# Patient Record
Sex: Male | Born: 1965 | Race: Black or African American | Hispanic: No | Marital: Married | Smoking: Current every day smoker
Health system: Southern US, Community
[De-identification: ages and names within clinical notes are randomized; demographics above are authoritative.]

## PROBLEM LIST (undated history)

## (undated) HISTORY — PX: HERNIA REPAIR: SHX51

---

## 2006-07-17 ENCOUNTER — Emergency Department: Payer: Self-pay | Admitting: Emergency Medicine

## 2008-02-05 ENCOUNTER — Emergency Department: Payer: Self-pay | Admitting: Emergency Medicine

## 2008-08-04 ENCOUNTER — Emergency Department: Payer: Self-pay | Admitting: Emergency Medicine

## 2009-05-31 ENCOUNTER — Emergency Department: Payer: Self-pay | Admitting: Emergency Medicine

## 2009-09-13 ENCOUNTER — Emergency Department: Payer: Self-pay | Admitting: Emergency Medicine

## 2010-08-01 ENCOUNTER — Emergency Department: Payer: Self-pay | Admitting: Emergency Medicine

## 2010-08-26 ENCOUNTER — Ambulatory Visit: Payer: Self-pay | Admitting: Surgery

## 2010-09-13 ENCOUNTER — Ambulatory Visit: Payer: Self-pay | Admitting: Surgery

## 2011-10-03 ENCOUNTER — Emergency Department: Payer: Self-pay | Admitting: Emergency Medicine

## 2014-07-27 ENCOUNTER — Emergency Department: Payer: Self-pay | Admitting: Emergency Medicine

## 2014-07-27 LAB — URINALYSIS, COMPLETE
BILIRUBIN, UR: NEGATIVE
BLOOD: NEGATIVE
GLUCOSE, UR: NEGATIVE mg/dL (ref 0–75)
Hyaline Cast: 8
Leukocyte Esterase: NEGATIVE
NITRITE: NEGATIVE
PH: 5 (ref 4.5–8.0)
Protein: NEGATIVE
RBC,UR: 5 /HPF (ref 0–5)
SQUAMOUS EPITHELIAL: NONE SEEN
Specific Gravity: 1.026 (ref 1.003–1.030)

## 2014-07-27 LAB — GC/CHLAMYDIA PROBE AMP

## 2018-09-18 ENCOUNTER — Other Ambulatory Visit: Payer: Self-pay | Admitting: Family Medicine

## 2018-09-18 DIAGNOSIS — S79921A Unspecified injury of right thigh, initial encounter: Secondary | ICD-10-CM

## 2018-09-20 ENCOUNTER — Ambulatory Visit
Admission: RE | Admit: 2018-09-20 | Discharge: 2018-09-20 | Disposition: A | Discharge status: Home / Self Care | Payer: Self-pay | Source: Ambulatory Visit | Attending: Family Medicine | Admitting: Family Medicine

## 2018-09-20 DIAGNOSIS — X58XXXA Exposure to other specified factors, initial encounter: Secondary | ICD-10-CM | POA: Insufficient documentation

## 2018-09-20 DIAGNOSIS — S79921A Unspecified injury of right thigh, initial encounter: Secondary | ICD-10-CM | POA: Insufficient documentation

## 2018-09-20 DIAGNOSIS — M7989 Other specified soft tissue disorders: Secondary | ICD-10-CM | POA: Insufficient documentation

## 2019-04-30 ENCOUNTER — Other Ambulatory Visit: Payer: Self-pay

## 2019-04-30 ENCOUNTER — Ambulatory Visit (INDEPENDENT_AMBULATORY_CARE_PROVIDER_SITE_OTHER): Payer: Worker's Compensation

## 2019-04-30 ENCOUNTER — Encounter: Payer: Self-pay | Admitting: Emergency Medicine

## 2019-04-30 ENCOUNTER — Ambulatory Visit
Admission: EM | Admit: 2019-04-30 | Discharge: 2019-04-30 | Disposition: A | Discharge status: Home / Self Care | Payer: Worker's Compensation | Attending: Emergency Medicine | Admitting: Emergency Medicine

## 2019-04-30 DIAGNOSIS — M25552 Pain in left hip: Secondary | ICD-10-CM

## 2019-04-30 DIAGNOSIS — S76012A Strain of muscle, fascia and tendon of left hip, initial encounter: Secondary | ICD-10-CM

## 2019-04-30 DIAGNOSIS — Z042 Encounter for examination and observation following work accident: Secondary | ICD-10-CM

## 2019-04-30 NOTE — ED Triage Notes (Signed)
Patient states that he was lifting a coil at work yesterday and felt pain in his left hip.  Patient reports ongoing pain in his left hip.

## 2019-04-30 NOTE — ED Provider Notes (Signed)
[facility] ____________________________________________  Time seen: Approximately 10:57 AM  I have reviewed the triage vital signs and the nursing notes.   HISTORY  Chief Complaint Hip Pain (left) and Worker's Comp Injury  HPI Travis Macdonald is a 53 y.o. male presenting for evaluation of left hip pain that started yesterday after injury at work.  Patient reports he was moving a heavy coil, and was holding and twisted to move it but kept left leg planted.  States as he twisted to his right he felt a pain in his left hip.  Denies fall or direct injury.  States pain was mild initially and then increase as the day went on.  States pain is currently mild and more so intermittent, dependent on particular movements.  States sitting or lifting his leg he feels the pain more.  Also some pain with weightbearing fully on his left leg.  Able to weight-bear fully.  Denies pain radiation, paresthesias, decreased range of motion, urinary or bowel retention or incontinence, abdominal pain, fever, cough or congestion.  Denies chest pain, shortness of breath or weakness.  Reports otherwise doing well.  Denies alleviating measures.  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   past medical history HTN  There are no active problems to display for this patient.   Past Surgical History:  Procedure Laterality Date  . HERNIA REPAIR       No current facility-administered medications for this encounter.  No current outpatient medications on file.  Allergies Patient has no known allergies.  Family History  Problem Relation Age of Onset  . Hypertension Mother   . Hypertension Father     Social History Social History   Tobacco Use  . Smoking status: Current Every Day Smoker    Types: Cigarettes  . Smokeless tobacco: Never Used  Substance Use Topics  . Alcohol use: Not Currently  . Drug use: Never    Review of Systems Constitutional: No fever Cardiovascular: Denies chest  pain. Respiratory: Denies shortness of breath. Gastrointestinal: No abdominal pain.  No nausea, no vomiting.  No diarrhea. Genitourinary: Negative for dysuria. Musculoskeletal: Positive left hip pain. Skin: Negative for rash.   ____________________________________________   PHYSICAL EXAM:  VITAL SIGNS: ED Triage Vitals  Enc Vitals Group     BP 04/30/19 1016 (!) 146/119     Pulse Rate 04/30/19 1016 86     Resp 04/30/19 1016 16     Temp 04/30/19 1016 98.1 F (36.7 C)     Temp Source 04/30/19 1016 Oral     SpO2 04/30/19 1016 96 %     Weight 04/30/19 1013 230 lb (104.3 kg)     Height 04/30/19 1013 5\' 11"  (1.803 m)     Head Circumference --      Peak Flow --      Pain Score 04/30/19 1013 8     Pain Loc --      Pain Edu? --      Excl. in [place]? --    Vitals:   04/30/19 1013 04/30/19 1016 04/30/19 1144  BP:  (!) 146/119 (!) 150/100  Pulse:  86   Resp:  16   Temp:  98.1 F (36.7 C)   TempSrc:  Oral   SpO2:  96%   Weight: 230 lb (104.3 kg)    Height: 5\' 11"  (1.803 m)       Constitutional: Alert and oriented. Well appearing and in no acute distress. ENT      Head: Normocephalic  and atraumatic. Cardiovascular: Normal rate, regular rhythm. Grossly normal heart sounds.  Good peripheral circulation. Respiratory: Normal respiratory effort without tachypnea nor retractions. Breath sounds are clear and equal bilaterally. No wheezes, rales, rhonchi. Gastrointestinal: Soft and nontender. Musculoskeletal: No midline cervical, thoracic or lumbar tenderness to palpation. Bilateral pedal pulses equal and easily palpated.  Ambulatory with steady gait.  Changes positions quickly.   Except: Left lateral hip mild tenderness to direct palpation, mild pain with independently weightbearing on left leg as well as lateral abduction, minimal pain with high knees raise the left leg, no pain to anterior pelvis or posterior pelvis, no pain at hip flexor, no ecchymosis.  Left lower extremity otherwise  nontender. Neurologic:  Normal speech and language. Speech is normal. No gait instability.  Skin:  Skin is warm, dry and intact. No rash noted. Psychiatric: Mood and affect are normal. Speech and behavior are normal. Patient exhibits appropriate insight and judgment   ___________________________________________   LABS (all labs ordered are listed, but only abnormal results are displayed)  Labs Reviewed - No data to display  RADIOLOGY  Dg Hip Unilat W Or Wo Pelvis 2-3 Views Left  Result Date: 04/30/2019 CLINICAL DATA:  Left lateral hip pain after twisting injury yesterday. EXAM: DG HIP (WITH OR WITHOUT PELVIS) 2-3V LEFT COMPARISON:  CT abdomen pelvis dated February 05, 2008. FINDINGS: No acute fracture or dislocation. Minimal bilateral hip joint space narrowing superiorly with small marginal osteophytes. The pubic symphysis and sacroiliac joints are intact. Bone mineralization is normal. Soft tissues are unremarkable. Prior ventral hernia repair. IMPRESSION: 1.  No acute osseous abnormality. 2. Early bilateral hip degenerative changes. Electronically Signed   By: Obie DredgeWilliam T Derry M.D.   On: 04/30/2019 11:37   ____________________________________________   PROCEDURES Procedures     INITIAL IMPRESSION / ASSESSMENT AND PLAN / ED COURSE  Pertinent labs & imaging results that were available during my care of the patient were reviewed by me and considered in my medical decision making (see chart for details).  Well-appearing patient.  No acute distress.  Left hip x-ray as above, no acute osseous abnormality, degenerative changes.  Suspect hip strain.  As patient lifts heavy items frequently at work, will write restriction for no lifting greater than 15 pounds for the next 3 days to allow for rest.  Encourage ice, stretching and supportive care.  Over-the-counter Tylenol ibuprofen as needed.  Follow-up with occupational health next week for continued pain.  Patient blood pressure also elevated in  urgent care, patient states that this was due to diet today.  States has been monitoring at home and has been within normal ranges.  Encourage patient to follow-up with his primary care.  Patient states that he will do this as he is due for physical.  Discussed follow up and return parameters including no resolution or any worsening concerns. Patient verbalized understanding and agreed to plan.   ____________________________________________   FINAL CLINICAL IMPRESSION(S) / ED DIAGNOSES  Final diagnoses:  Left hip pain  Hip strain, left, initial encounter     ED Discharge Orders    None       Note: This dictation was prepared with Dragon dictation along with smaller phrase technology. Any transcriptional errors that result from this process are unintentional.         Renford DillsMiller, Elliot Meldrum, NP 04/30/19 1207

## 2019-04-30 NOTE — Discharge Instructions (Signed)
Over-the-counter Tylenol ibuprofen as needed.  Ice.  Stretch.  Gradual increase of activity as tolerated.  Monitor your blood pressure and follow-up with your primary care.  Follow up with your primary care physician this week .Return to Urgent care for new or worsening concerns.

## 2019-05-28 ENCOUNTER — Ambulatory Visit
Admission: EM | Admit: 2019-05-28 | Discharge: 2019-05-28 | Disposition: A | Discharge status: Home / Self Care | Payer: BC Managed Care – PPO | Attending: Family Medicine | Admitting: Family Medicine

## 2019-05-28 ENCOUNTER — Other Ambulatory Visit: Payer: Self-pay

## 2019-05-28 ENCOUNTER — Encounter: Payer: Self-pay | Admitting: Emergency Medicine

## 2019-05-28 DIAGNOSIS — H5789 Other specified disorders of eye and adnexa: Secondary | ICD-10-CM

## 2019-05-28 NOTE — Discharge Instructions (Signed)
Call your eye doctor.  No worrisome findings/history.  Take care  Dr. Lacinda Axon

## 2019-05-28 NOTE — ED Provider Notes (Signed)
[facility]    CSN: [id number] Arrival date & time: 05/28/19  0803    History   Chief Complaint Chief Complaint  Patient presents with  . Eye Problem    left    HPI  53 year old male presents the above complaint.  Patient reports a 3-day history of left eye redness.  Denies discharge, pain, itching, injury.  Patient states that he thought that this may be a subconjunctival hemorrhage.  He does have underlying hypertension.  No visual disturbance.  His vision is 20/25 in both eyes today.  Patient states that he wanted to get it checked out just to be on the safe side.  He is due for an eye exam.  No medications or interventions tried.  No other associated symptoms.  No other complaints.  PMH, Surgical Hx, Family Hx, Social History reviewed and updated as below.  PMH: Hypertension, Hx of colon polyp  Past Surgical History:  Procedure Laterality Date  . HERNIA REPAIR      Home Medications    Prior to Admission medications   Not on File    Family History Family History  Problem Relation Age of Onset  . Hypertension Mother   . Hypertension Father     Social History Social History   Tobacco Use  . Smoking status: Current Every Day Smoker    Types: Cigarettes  . Smokeless tobacco: Never Used  Substance Use Topics  . Alcohol use: Not Currently  . Drug use: Never     Allergies   Patient has no known allergies.   Review of Systems Review of Systems  Constitutional: Negative.   Eyes: Positive for redness. Negative for photophobia, pain, discharge, itching and visual disturbance.   Physical Exam Triage Vital Signs ED Triage Vitals  Enc Vitals Group     BP 05/28/19 0818 (!) 146/104     Pulse Rate 05/28/19 0818 76     Resp 05/28/19 0818 18     Temp 05/28/19 0818 98.6 F (37 C)     Temp Source 05/28/19 0818 Oral     SpO2 05/28/19 0818 96 %     Weight 05/28/19 0815 223 lb (101.2 kg)     Height 05/28/19 0815 5\' 11"  (1.803 m)     Head  Circumference --      Peak Flow --      Pain Score 05/28/19 0815 0     Pain Loc --      Pain Edu? --      Excl. in [place]? --    Updated Vital Signs BP (!) 146/104 (BP Location: Left Arm)   Pulse 76   Temp 98.6 F (37 C) (Oral)   Resp 18   Ht 5\' 11"  (1.803 m)   Wt 101.2 kg   SpO2 96%   BMI 31.10 kg/m   Visual Acuity Right Eye Distance: 20/25(corrected) Left Eye Distance: 20/25(corrected) Bilateral Distance: 20/25(corrected)  Right Eye Near:   Left Eye Near:    Bilateral Near:     Physical Exam Vitals signs and nursing note reviewed.  Constitutional:      General: He is not in acute distress.    Appearance: Normal appearance.  HENT:     Head: Normocephalic and atraumatic.     Nose: Nose normal.  Eyes:     Comments: Left eye with redness medially.  No discharge or watering.  PERRLA.  EOMI.  Pulmonary:     Effort: Pulmonary effort is normal. No respiratory distress.  Skin:  General: Skin is warm.     Findings: No rash.  Neurological:     Mental Status: He is alert.  Psychiatric:        Mood and Affect: Mood normal.        Behavior: Behavior normal.    UC Treatments / Results  Labs (all labs ordered are listed, but only abnormal results are displayed) Labs Reviewed - No data to display  EKG None  Radiology No results found.  Procedures Procedures (including critical care time)  Medications Ordered in UC Medications - No data to display  Initial Impression / Assessment and Plan / UC Course  I have reviewed the triage vital signs and the nursing notes.  Pertinent labs & imaging results that were available during my care of the patient were reviewed by me and considered in my medical decision making (see chart for details).    53 year old male presents with redness of the left eye.  Does not have a classic appearance of subconjunctival hemorrhage.  However, he does not have any other worrisome findings on exam or history.  Advised supportive care.   Reassurance provided.  Advised to call his eye doctor for an appointment this week.  Final Clinical Impressions(s) / UC Diagnoses   Final diagnoses:  Redness of left eye     Discharge Instructions     Call your eye doctor.  No worrisome findings/history.  Take care  Dr. Adriana Simasook    ED Prescriptions    None     Controlled Substance Prescriptions [place] Controlled Substance Registry consulted? Not Applicable   Tommie SamsCook, Romelle Reiley G, [place] 05/28/19 [phone]

## 2019-05-28 NOTE — ED Triage Notes (Signed)
Pt c/o eye redness. Started about 3 days ago. Denies itching, pain or injury.

## 2019-09-24 ENCOUNTER — Ambulatory Visit: Payer: Self-pay

## 2019-09-24 ENCOUNTER — Other Ambulatory Visit: Payer: Self-pay

## 2019-09-24 VITALS — BP 154/82 | HR 83 | Resp 16 | Ht 71.0 in | Wt 215.0 lb

## 2019-09-24 DIAGNOSIS — Z008 Encounter for other general examination: Secondary | ICD-10-CM

## 2019-09-24 LAB — POCT LIPID PANEL
HDL: 29
POC Glucose: 108 mg/dl — AB (ref 70–99)
TC: <100
TRG: 97

## 2019-09-24 NOTE — Progress Notes (Signed)
     Patient ID: Travis Macdonald, male    DOB: 1966-05-27, 53 y.o.   MRN: [id number]    Thank you!!  [place] Nurse Specialist [place]: [phone]  Cell:  [phone] Website: [email or web address]

## 2019-11-21 ENCOUNTER — Ambulatory Visit: Payer: Self-pay

## 2019-11-21 ENCOUNTER — Other Ambulatory Visit: Payer: Self-pay

## 2019-11-21 VITALS — BP 142/100 | HR 83 | Resp 18

## 2019-11-21 DIAGNOSIS — Z0130 Encounter for examination of blood pressure without abnormal findings: Secondary | ICD-10-CM

## 2019-11-21 NOTE — Progress Notes (Signed)
   Subjective:    Patient ID: Travis Macdonald, male    DOB: [birth year], 53 y.o.   MRN: [id number]  HPI    Review of Systems     Objective:   Physical Exam  Assessment & Plan:   Wt Readings from Last 3 Encounters:  09/24/19 215 lb (97.5 kg)  05/28/19 223 lb (101.2 kg)  04/30/19 230 lb (104.3 kg)   Temp Readings from Last 3 Encounters:  05/28/19 98.6 F (37 C) (Oral)  04/30/19 98.1 F (36.7 C) (Oral)   BP Readings from Last 3 Encounters:  11/21/19 (!) 142/100  09/24/19 (!) 154/82  05/28/19 (!) 146/104   Pulse Readings from Last 3 Encounters:  11/21/19 83  09/24/19 83  05/28/19 76     Lab Results  Component Value Date   POCGLU 108 (A) 09/24/2019           Thank you!!  Apolonio Schneiders RN  [place] Nurse Specialist [place]: [phone]  Cell:  [phone] Website: [email or web address]

## 2019-11-21 NOTE — Patient Instructions (Signed)

## 2019-12-04 ENCOUNTER — Ambulatory Visit: Payer: BC Managed Care – PPO | Attending: Internal Medicine

## 2019-12-04 DIAGNOSIS — Z20822 Contact with and (suspected) exposure to covid-19: Secondary | ICD-10-CM

## 2019-12-06 ENCOUNTER — Telehealth: Payer: Self-pay

## 2019-12-06 NOTE — Telephone Encounter (Signed)
Pt called for covid results advised that results are not back 

## 2019-12-07 NOTE — Telephone Encounter (Signed)
Pt called for covid results advised that results are not back 

## 2019-12-10 ENCOUNTER — Ambulatory Visit: Payer: BC Managed Care – PPO | Attending: Internal Medicine

## 2019-12-10 DIAGNOSIS — Z20822 Contact with and (suspected) exposure to covid-19: Secondary | ICD-10-CM

## 2019-12-10 LAB — NOVEL CORONAVIRUS, NAA

## 2019-12-12 LAB — NOVEL CORONAVIRUS, NAA: SARS-CoV-2, NAA: NOT DETECTED

## 2019-12-26 ENCOUNTER — Ambulatory Visit
Admission: EM | Admit: 2019-12-26 | Discharge: 2019-12-26 | Disposition: A | Discharge status: Home / Self Care | Payer: Worker's Compensation | Attending: Family Medicine | Admitting: Family Medicine

## 2019-12-26 ENCOUNTER — Other Ambulatory Visit: Payer: Self-pay

## 2019-12-26 ENCOUNTER — Encounter: Payer: Self-pay | Admitting: Emergency Medicine

## 2019-12-26 DIAGNOSIS — R030 Elevated blood-pressure reading, without diagnosis of hypertension: Secondary | ICD-10-CM

## 2019-12-26 DIAGNOSIS — S39012A Strain of muscle, fascia and tendon of lower back, initial encounter: Secondary | ICD-10-CM | POA: Diagnosis not present

## 2019-12-26 DIAGNOSIS — X500XXA Overexertion from strenuous movement or load, initial encounter: Secondary | ICD-10-CM | POA: Diagnosis not present

## 2019-12-26 DIAGNOSIS — M545 Low back pain, unspecified: Secondary | ICD-10-CM

## 2019-12-26 MED ORDER — CYCLOBENZAPRINE HCL 10 MG PO TABS: ORAL_TABLET | ORAL | 0 refills | Status: AC

## 2019-12-26 MED ORDER — NAPROXEN 500 MG PO TABS: 500.0000 mg | ORAL_TABLET | Freq: Two times a day (BID) | ORAL | 0 refills | Status: AC | PRN

## 2019-12-26 NOTE — ED Provider Notes (Signed)
[facility]    CSN: [id number] Arrival date & time: 12/26/19  1002      History   Chief Complaint Chief Complaint  Patient presents with  . Worker's Comp Injury  . Back Pain    HPI Travis Macdonald is a 54 y.o. male.   54 year old male presents with injury to his lower back. He was at work when he lifted an 80 lb metal core to place on a pallet. He felt an immediate pain when lifting the object and has noticed increased pain with movement since this occurred around 5:30am this morning. He denies any radiation of pain down his legs or any numbness. Has history of occasional back pain but no symptoms for over a year. Has not taken any medications yet- came here straight from work. Does have a history of elevated BP- was prescribed BP meds in the past but stopped taking them when he lost weight. Has appointment in 2 months with his PCP for further evaluation of HTN. Otherwise no chronic health issues. Takes no daily medication.   The history is provided by the patient.  Back Pain Location:  Lumbar spine Quality:  Shooting Radiates to:  Does not radiate Pain severity:  Moderate Pain is:  Same all the time Onset quality:  Sudden Duration:  5 hours Timing:  Constant Progression:  Worsening Chronicity:  New Context: lifting heavy objects and occupational injury   Context: not emotional stress, not falling, not jumping from heights, not MCA, not MVA and not recent illness   Relieved by:  Nothing Worsened by:  Movement Ineffective treatments:  None tried Associated symptoms: no abdominal pain, no bladder incontinence, no bowel incontinence, no chest pain, no fever, no leg pain, no numbness, no paresthesias, no perianal numbness, no tingling and no weakness   Risk factors: no hx of cancer and no hx of osteoporosis     History reviewed. No pertinent past medical history.  There are no problems to display for this patient.   Past Surgical History:  Procedure Laterality  Date  . HERNIA REPAIR         Home Medications    Prior to Admission medications   Medication Sig Start Date End Date Taking? Authorizing Provider  cyclobenzaprine (FLEXERIL) 10 MG tablet Take 1/2 to 1 whole tablet by mouth every 8 to 12 hours as needed for muscle spasms/pain. 12/26/19   Sudie Grumbling, NP  naproxen (NAPROSYN) 500 MG tablet Take 1 tablet (500 mg total) by mouth 2 (two) times daily as needed for moderate pain. 12/26/19   Sudie Grumbling, NP    Family History Family History  Problem Relation Age of Onset  . Hypertension Mother   . Hypertension Father     Social History Social History   Tobacco Use  . Smoking status: Current Every Day Smoker    Types: Cigarettes  . Smokeless tobacco: Never Used  Substance Use Topics  . Alcohol use: Not Currently  . Drug use: Never     Allergies   Patient has no known allergies.   Review of Systems Review of Systems  Constitutional: Negative for activity change, chills and fever.  Eyes: Negative for photophobia and visual disturbance.  Respiratory: Negative for chest tightness, shortness of breath and wheezing.   Cardiovascular: Negative for chest pain.  Gastrointestinal: Negative for abdominal pain, bowel incontinence, nausea and vomiting.  Genitourinary: Negative for bladder incontinence, decreased urine volume, difficulty urinating and hematuria.  Musculoskeletal: Positive for back  pain. Negative for arthralgias and gait problem.  Skin: Negative for color change, rash and wound.  Allergic/Immunologic: Negative for environmental allergies, food allergies and immunocompromised state.  Neurological: Negative for dizziness, tingling, tremors, seizures, syncope, weakness, light-headedness, numbness and paresthesias.  Hematological: Negative for adenopathy. Does not bruise/bleed easily.     Physical Exam Triage Vital Signs ED Triage Vitals  Enc Vitals Group     BP 12/26/19 1023 (!) 145/100     Pulse Rate 12/26/19  1023 73     Resp 12/26/19 1023 16     Temp 12/26/19 1023 98.6 F (37 C)     Temp Source 12/26/19 1023 Oral     SpO2 12/26/19 1023 98 %     Weight 12/26/19 1021 218 lb (98.9 kg)     Height 12/26/19 1021 5\' 11"  (1.803 m)     Head Circumference --      Peak Flow --      Pain Score 12/26/19 1021 8     Pain Loc --      Pain Edu? --      Excl. in [place]? --    No data found.  Updated Vital Signs BP (!) 145/100 (BP Location: Left Arm)   Pulse 73   Temp 98.6 F (37 C) (Oral)   Resp 16   Ht 5\' 11"  (1.803 m)   Wt 218 lb (98.9 kg)   SpO2 98%   BMI 30.40 kg/m   Visual Acuity Right Eye Distance:   Left Eye Distance:   Bilateral Distance:    Right Eye Near:   Left Eye Near:    Bilateral Near:     Physical Exam Vitals and nursing note reviewed.  Constitutional:      General: He is awake. He is not in acute distress.    Appearance: He is well-developed and well-groomed. He is not ill-appearing.     Comments: Patient lying down on exam table in no acute distress but changing positions increases pain.   HENT:     Head: Normocephalic and atraumatic.  Eyes:     Extraocular Movements: Extraocular movements intact.     Conjunctiva/sclera: Conjunctivae normal.  Cardiovascular:     Rate and Rhythm: Normal rate and regular rhythm.     Heart sounds: Normal heart sounds. No murmur.  Pulmonary:     Effort: Pulmonary effort is normal.     Breath sounds: Normal breath sounds.  Musculoskeletal:        General: Tenderness present. No swelling or deformity.     Cervical back: Normal and normal range of motion.     Thoracic back: Normal.     Lumbar back: Tenderness present. No swelling, edema, deformity or lacerations. Decreased range of motion. Positive right straight leg raise test and positive left straight leg raise test. No scoliosis.       Back:     Right lower leg: No edema.     Left lower leg: No edema.     Comments: Slight decreased range of motion and pain in lower back-  particularly with flexion and rotation. Slight tenderness to lower lumbar muscles with mild spasms present. No redness, swelling or rash. No distinct neuro deficits noted. Good distal pulses.   Skin:    General: Skin is warm and dry.     Findings: No rash.  Neurological:     General: No focal deficit present.     Mental Status: He is alert and oriented to person, place, and time.  Sensory: Sensation is intact. No sensory deficit.     Motor: Motor function is intact.     Gait: Gait is intact.     Deep Tendon Reflexes: Reflexes are normal and symmetric.  Psychiatric:        Mood and Affect: Mood normal.        Behavior: Behavior normal. Behavior is cooperative.        Thought Content: Thought content normal.        Judgment: Judgment normal.      UC Treatments / Results  Labs (all labs ordered are listed, but only abnormal results are displayed) Labs Reviewed - No data to display  EKG   Radiology No results found.  Procedures Procedures (including critical care time)  Medications Ordered in UC Medications - No data to display  Initial Impression / Assessment and Plan / UC Course  I have reviewed the triage vital signs and the nursing notes.  Pertinent labs & imaging results that were available during my care of the patient were reviewed by me and considered in my medical decision making (see chart for details).    Reviewed with patient that he probably has strained the muscles in his lower back due to lifting. Do not feel that imaging is needed at this time. Recommend start Naproxen 500mg  twice a day as needed for pain and inflammation. May take Flexeril 10mg  1/2 to 1 whole tablet every 8 to 12 hours as needed for muscle spasms/pain- may cause drowsiness. Apply warm compresses to area for comfort- may alternate with ice. No lifting for at least 3 days then may lift more than 20 lbs only with assistance. Discussed minimal bed rest- remain active to decrease stiffness. Note  written for work- out today and tomorrow. May return on Monday with restrictions. Continue to monitor blood pressure and follow-up with your PCP as planned- may need to restart BP meds. Follow-up with Occupational Health for back pain in 3 to 4 days if not improving.  Final Clinical Impressions(s) / UC Diagnoses   Final diagnoses:  Low back strain, initial encounter  Acute bilateral low back pain without sciatica  Elevated blood pressure reading     Discharge Instructions     Recommend start Naproxen 500mg  twice a day as directed for pain and inflammation. May take Flexeril muscle relaxer- take 1/2 to 1 whole tablet every 8 to 12 hours as needed for muscle spasms/pain - may cause drowsiness. Apply warm heat to area for comfort- may alternate with ice. No lifting for at least 3 days. Follow-up with Occupational Health, , NP in 3 to 4 days if not improving.     ED Prescriptions    Medication Sig Dispense Auth. Provider   naproxen (NAPROSYN) 500 MG tablet Take 1 tablet (500 mg total) by mouth 2 (two) times daily as needed for moderate pain. 20 tablet Tuesday, NP   cyclobenzaprine (FLEXERIL) 10 MG tablet Take 1/2 to 1 whole tablet by mouth every 8 to 12 hours as needed for muscle spasms/pain. 15 tablet Dvid Pendry, , NP     PDMP not reviewed this encounter.   Joanna Hews, NP 12/26/19 1549

## 2019-12-26 NOTE — Discharge Instructions (Addendum)
Recommend start Naproxen 500mg  twice a day as directed for pain and inflammation. May take Flexeril muscle relaxer- take 1/2 to 1 whole tablet every 8 to 12 hours as needed for muscle spasms/pain - may cause drowsiness. Apply warm heat to area for comfort- may alternate with ice. No lifting for at least 3 days. Follow-up with Occupational Health, , NP in 3 to 4 days if not improving.

## 2019-12-26 NOTE — ED Triage Notes (Addendum)
Patient states that early this morning he was lifting a metal core that weighed maybe 80lb and was putting it on a pallet and felt a sharp pain in his lower back around 5:30 am this morning while at work.   Urine Drug screen collected per protocol and place in [facility] for pick up.
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
## 2020-02-19 ENCOUNTER — Ambulatory Visit: Payer: Self-pay | Attending: Internal Medicine

## 2020-02-19 DIAGNOSIS — Z23 Encounter for immunization: Secondary | ICD-10-CM

## 2020-02-19 NOTE — Progress Notes (Signed)
   Covid-19 Vaccination Clinic  Name:  Travis Macdonald    MRN: [id number] DOB: [birth year]  02/19/2020  Travis Macdonald was observed post Covid-19 immunization for 15 minutes without incident. He was provided with Vaccine Information Sheet and instruction to access the V-Safe system.   Travis Macdonald was instructed to call 911 with any severe reactions post vaccine: Marland Kitchen Difficulty breathing  . Swelling of face and throat  . A fast heartbeat  . A bad rash all over body  . Dizziness and weakness   Immunizations Administered    Name Date Dose VIS Date Route   Pfizer COVID-19 Vaccine 02/19/2020  9:42 AM 0.3 mL 11/14/2019 Intramuscular   Manufacturer: [employer], [employer]   Lot: [id number]   NDC: [address]-9

## 2020-02-28 ENCOUNTER — Ambulatory Visit: Payer: Self-pay

## 2020-03-17 ENCOUNTER — Ambulatory Visit: Payer: Self-pay | Attending: Internal Medicine

## 2020-03-17 DIAGNOSIS — Z23 Encounter for immunization: Secondary | ICD-10-CM

## 2020-03-17 NOTE — Progress Notes (Signed)
   Covid-19 Vaccination Clinic  Name:  Travis Macdonald    MRN: [id number] DOB: 06-06-1966  03/17/2020  Travis Macdonald was observed post Covid-19 immunization for 15 minutes without incident. He was provided with Vaccine Information Sheet and instruction to access the V-Safe system.   Travis Macdonald was instructed to call 911 with any severe reactions post vaccine: Marland Kitchen Difficulty breathing  . Swelling of face and throat  . A fast heartbeat  . A bad rash all over body  . Dizziness and weakness   Immunizations Administered    Name Date Dose VIS Date Route   Pfizer COVID-19 Vaccine 03/17/2020  1:49 PM 0.3 mL 11/14/2019 Intramuscular   Manufacturer: [employer], [employer]   Lot: [id number]   NDC: [address]-0

## 2020-04-07 IMAGING — CR DG HIP (WITH OR WITHOUT PELVIS) 2-3V LEFT
3 series · 4 of 4 positions shown · non-contrast
Comparison: CT abdomen pelvis dated February 05, 2008.

CLINICAL DATA: Left lateral hip pain after twisting injury
yesterday.

EXAM:
DG HIP (WITH OR WITHOUT PELVIS) 2-3V LEFT

[Series 1: pelvis ap · 0.14mm/px · 2 of 2 slices shown]
[im 1/2]
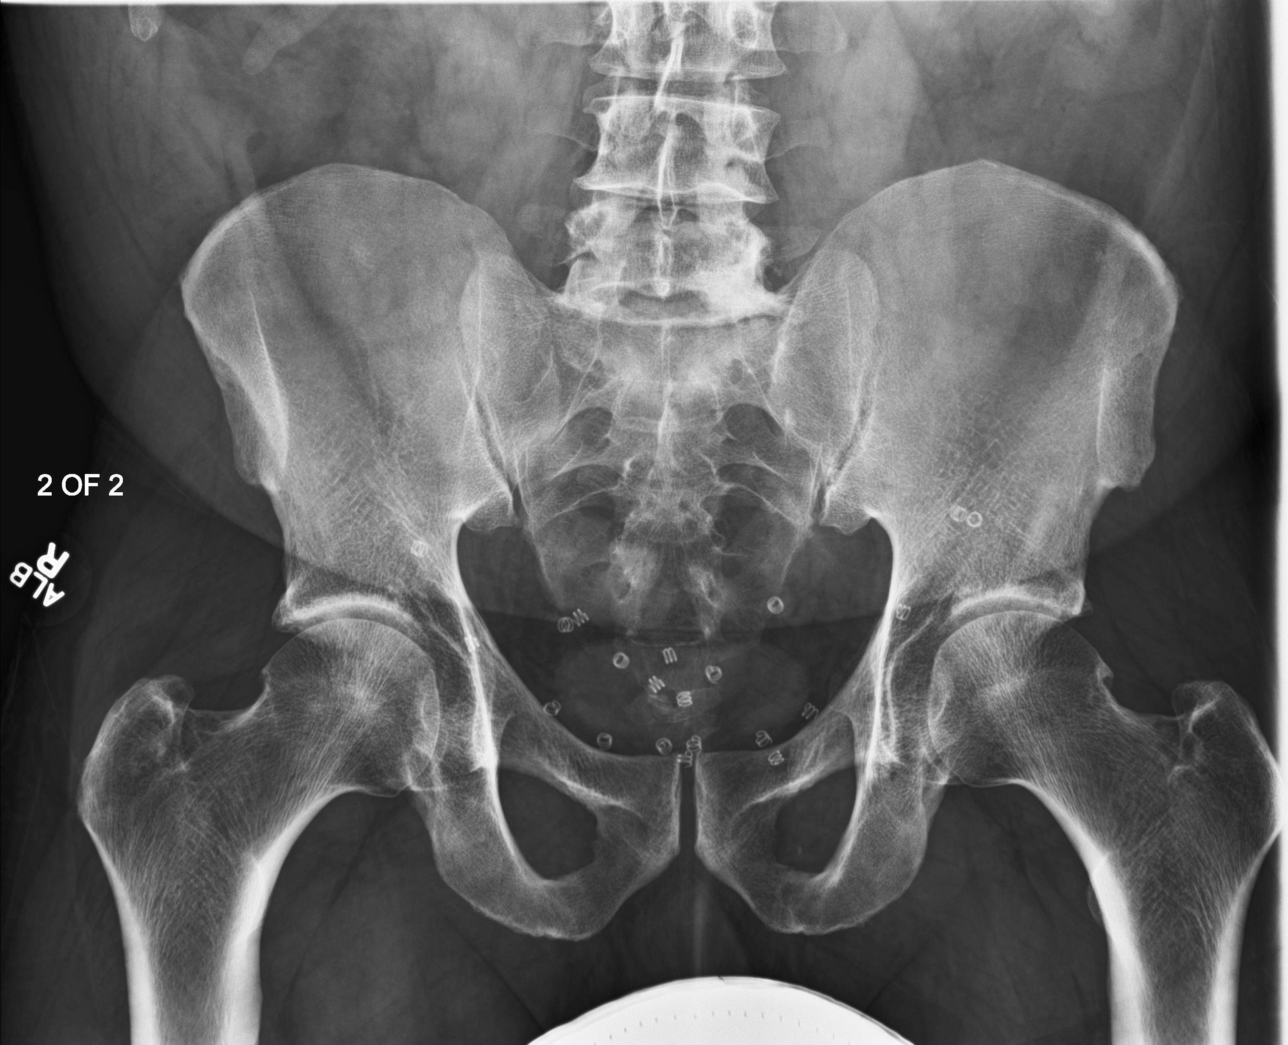
[im 2/2]
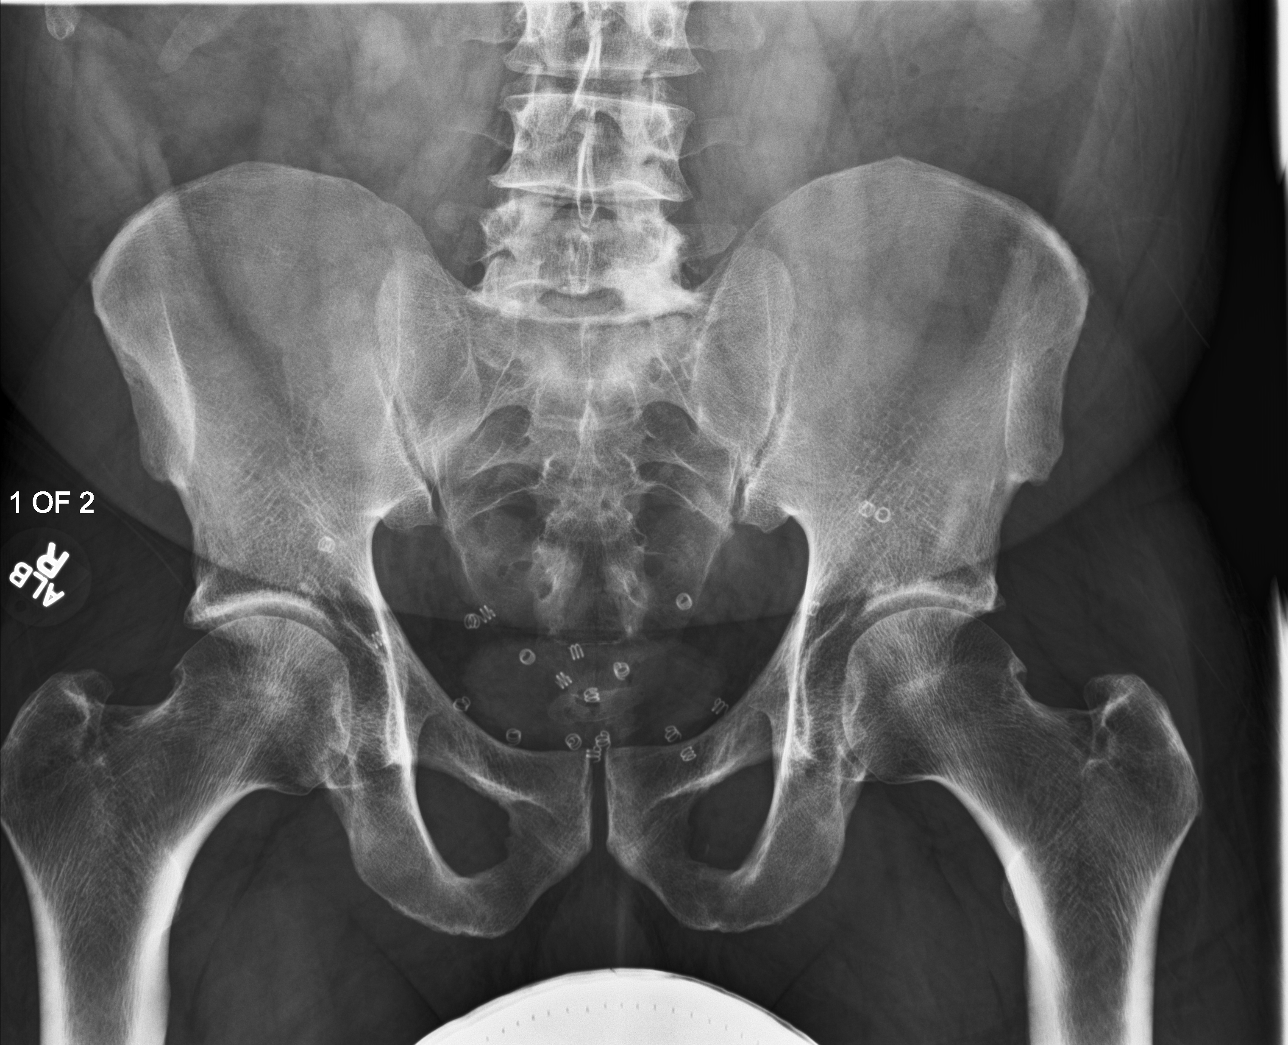

[hip ap]
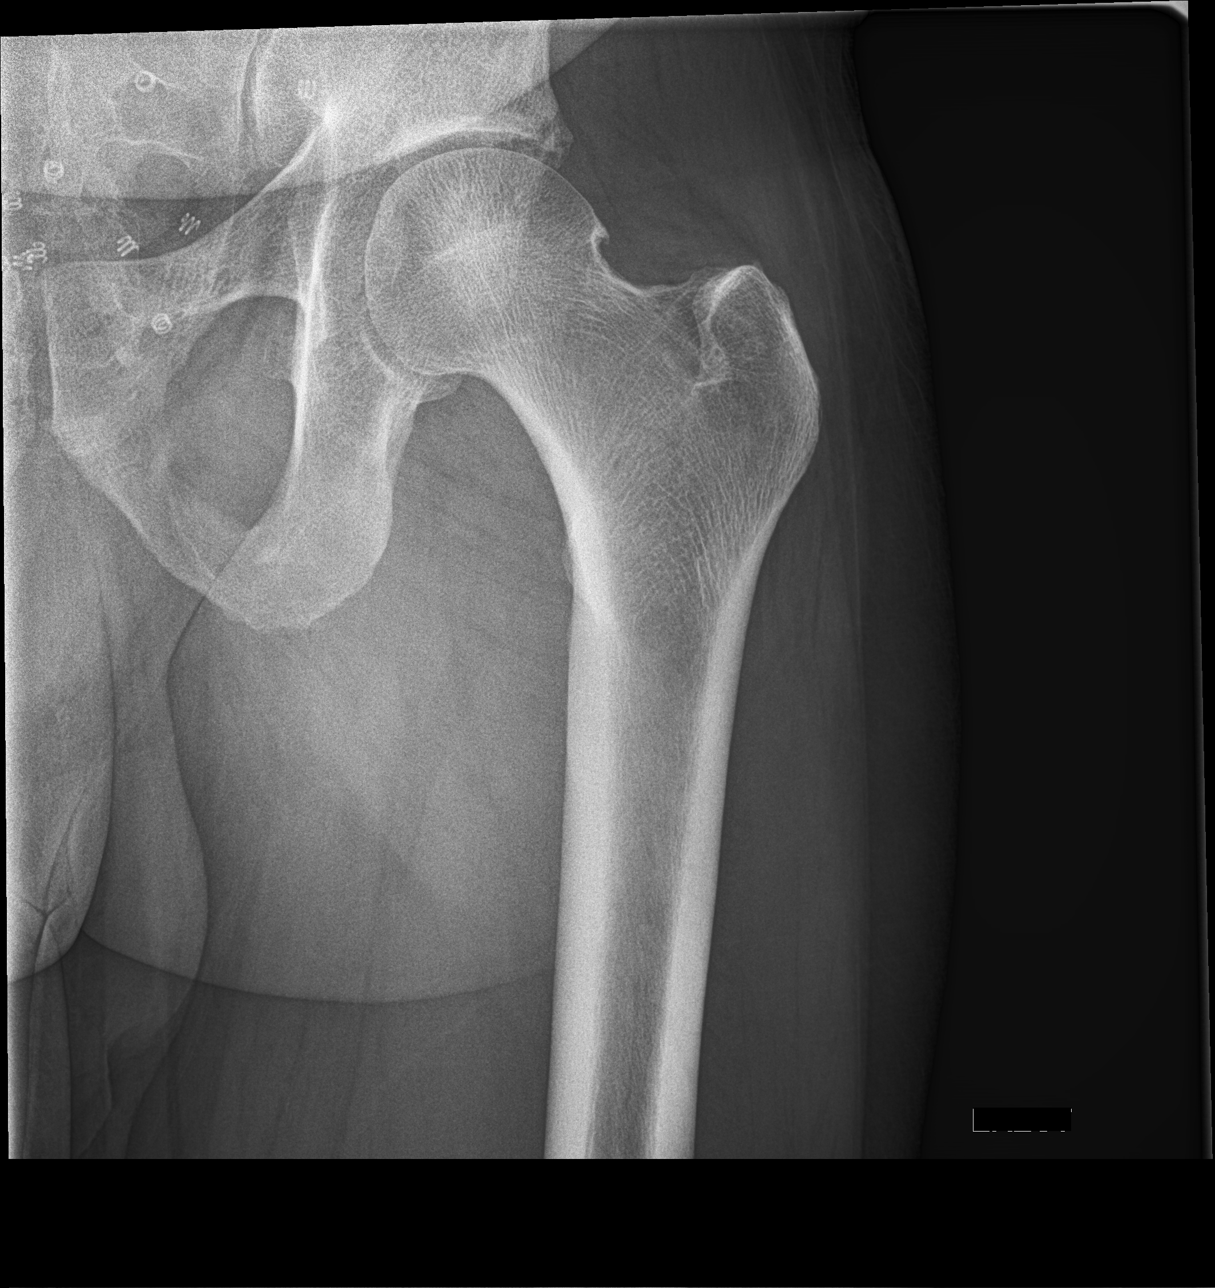

[hip lat]
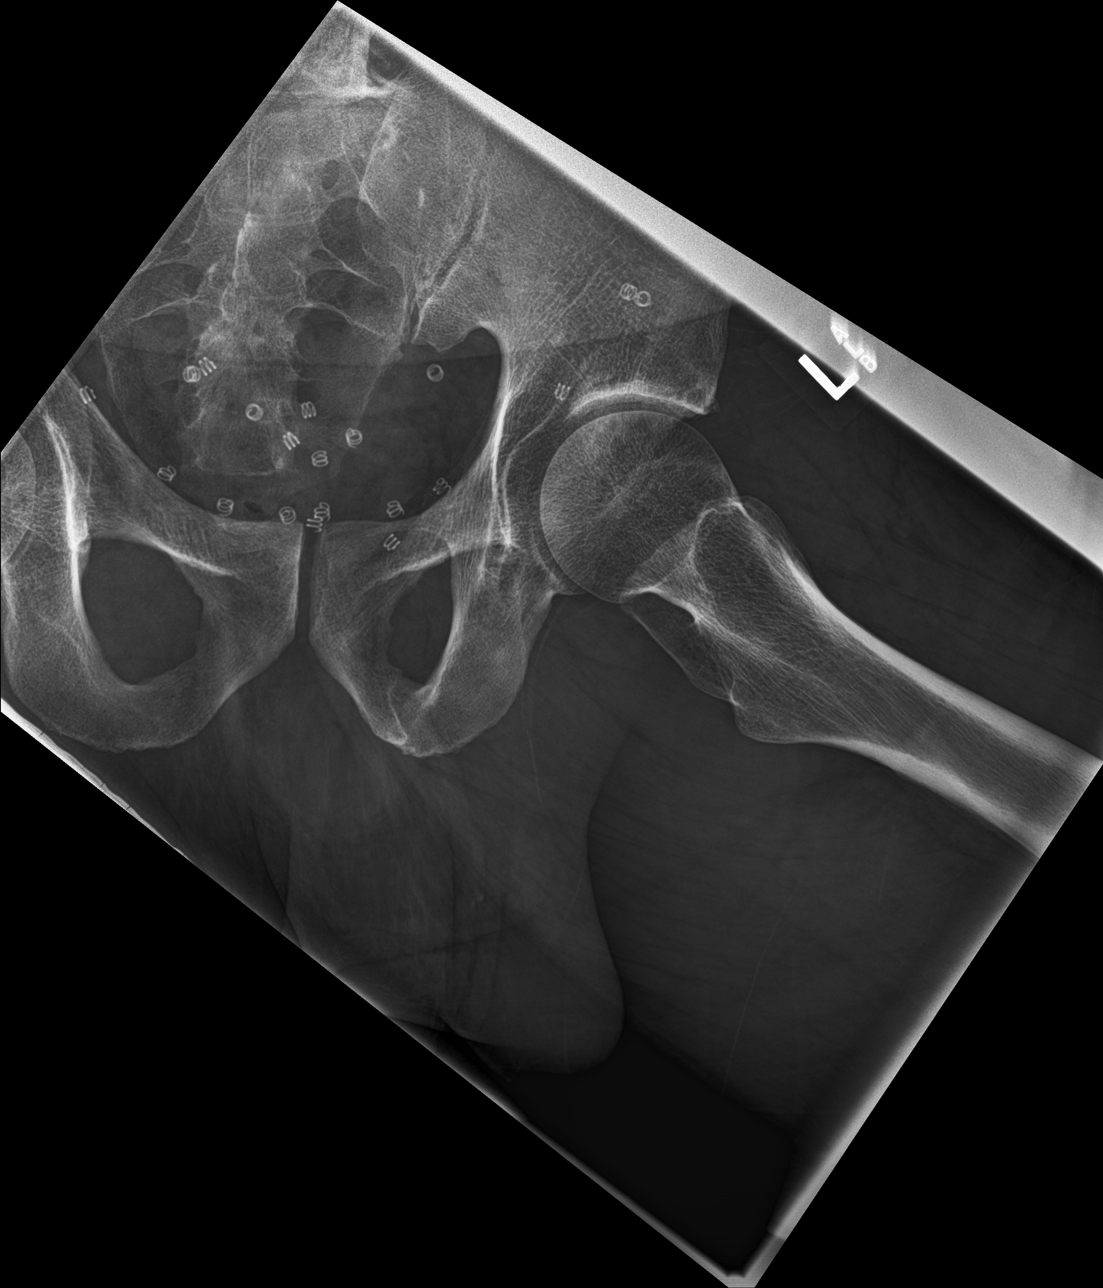

[4 of 4 positions shown; findings below may reference images not displayed]

FINDINGS: No acute fracture or dislocation. Minimal bilateral hip joint space
narrowing superiorly with small marginal osteophytes. The pubic
symphysis and sacroiliac joints are intact. Bone mineralization is
normal. Soft tissues are unremarkable. Prior ventral hernia repair.
IMPRESSION: 1.  No acute osseous abnormality.
2. Early bilateral hip degenerative changes.

## 2021-12-30 ENCOUNTER — Telehealth: Payer: Self-pay | Admitting: Acute Care

## 2021-12-30 NOTE — Telephone Encounter (Signed)
Attempted to contact pt regarding lung screening referral. Unable to leave message. VM full. Will call back.

## 2022-12-01 ENCOUNTER — Encounter: Payer: Self-pay | Admitting: *Deleted

## 2023-02-25 ENCOUNTER — Emergency Department
Admission: EM | Admit: 2023-02-25 | Discharge: 2023-02-25 | Disposition: A | Discharge status: Home / Self Care | Payer: Self-pay | Attending: Emergency Medicine | Admitting: Emergency Medicine

## 2023-02-25 ENCOUNTER — Other Ambulatory Visit: Payer: Self-pay

## 2023-02-25 ENCOUNTER — Emergency Department: Payer: Self-pay

## 2023-02-25 DIAGNOSIS — M25512 Pain in left shoulder: Secondary | ICD-10-CM | POA: Insufficient documentation

## 2023-02-25 DIAGNOSIS — M7918 Myalgia, other site: Secondary | ICD-10-CM

## 2023-02-25 DIAGNOSIS — I10 Essential (primary) hypertension: Secondary | ICD-10-CM | POA: Insufficient documentation

## 2023-02-25 DIAGNOSIS — R030 Elevated blood-pressure reading, without diagnosis of hypertension: Secondary | ICD-10-CM

## 2023-02-25 NOTE — ED Notes (Signed)
X-ray at bedside

## 2023-02-25 NOTE — Discharge Instructions (Addendum)
Call make an appointment at [facility] or use the walk-in clinic at [facility] to have your blood pressure rechecked as it was elevated in the emergency department today.  If it continues to be elevated you will definitely need to see your primary care provider.  Begin taking the medication that you have at home especially the meloxicam which is an anti-inflammatory and should help with your clavicle pain.  You may use ice to the area as needed for discomfort.

## 2023-02-25 NOTE — ED Provider Notes (Signed)
[facility] Provider Note    Event Date/Time   First MD Initiated Contact with Patient 02/25/23 0745     (approximate)   History   Muscle Pain   HPI  Travis Macdonald is a 57 y.o. male presents to the ED with complaint of left clavicle pain without history of injury.  Patient states that 2 days ago he heard his clavicle pop and has continued to have pain.  Patient reports he still is able to move his left arm without any difficulty.  No prior fracture in this area.  Patient has a history of hypertension reports that he did take his blood pressure medication today.     Physical Exam   Triage Vital Signs: ED Triage Vitals  Enc Vitals Group     BP 02/25/23 0745 (!) 154/108     Pulse Rate 02/25/23 0745 78     Resp 02/25/23 0745 16     Temp 02/25/23 0745 98.2 F (36.8 C)     Temp Source 02/25/23 0745 Oral     SpO2 02/25/23 0745 97 %     Weight 02/25/23 0744 200 lb (90.7 kg)     Height 02/25/23 0744 5\' 11"  (1.803 m)     Head Circumference --      Peak Flow --      Pain Score 02/25/23 0744 7     Pain Loc --      Pain Edu? --      Excl. in [place]? --     Most recent vital signs: Vitals:   02/25/23 0854 02/25/23 0900  BP: (!) 149/95 (!) 152/99  Pulse: 69 73  Resp: 16 14  Temp:    SpO2: 95% 96%     General: Awake, no distress.  CV:  Good peripheral perfusion.  Resp:  Normal effort.  Lungs are clear bilaterally. Abd:  No distention.  Other:  On examination of the ED left clavicle there is no gross deformity and no evidence of soft tissue injury, abrasions or bruises.  Patient is able to move upper extremity without any difficulty or restrictions.  Minimal tenderness on palpation of the proximal left clavicle.   ED Results / Procedures / Treatments   Labs (all labs ordered are listed, but only abnormal results are displayed) Labs Reviewed - No data to display    RADIOLOGY Left Clavicle x-ray images were reviewed by myself independent of the  radiologist and was negative for fracture or separation.  Official radiology report is negative.    PROCEDURES:  Critical Care performed:   Procedures   MEDICATIONS ORDERED IN ED: Medications - No data to display   IMPRESSION / MDM / [place] / ED COURSE  I reviewed the triage vital signs and the nursing notes.   Differential diagnosis includes, but is not limited to, fracture left clavicle, osteoarthritis, AC separation, contusion, muscle skeletal pain.  57 year old male presents to the ED with complaint of left clavicle pain that started 2 days ago without any history of injury.  X-rays were reassuring and patient was made aware that his x-rays were negative for fracture or dislocation.  We discussed his blood pressure which was improved prior to discharge and reminded him to follow-up with his PCP and take his blood pressure medication daily.  He may take over-the-counter ibuprofen or Tylenol as needed for pain.      Patient's presentation is most consistent with acute complicated illness / injury requiring diagnostic workup.  FINAL  CLINICAL IMPRESSION(S) / ED DIAGNOSES   Final diagnoses:  Musculoskeletal pain  Elevated blood pressure reading     Rx / DC Orders   ED Discharge Orders     None        Note:  This document was prepared using Dragon voice recognition software and may include unintentional dictation errors.   Johnn Hai, PA-C 02/25/23 1243    Rada Hay, MD 02/25/23 [phone]

## 2023-02-25 NOTE — ED Triage Notes (Signed)
Pt states that on this past Friday he was moving and heard a pop to his left clavicle. Pt reports 7/10 pain to that area. Pt able to still move left arm

## 2023-02-25 NOTE — ED Notes (Signed)
Pt ambulatory to room. Pt complains of feeling L clavicle "pop" 2 days ago while he was pushing back with elbows getting OOB. L clavicle has area that is not symmetrical and is enlarged in medial area, compared to R clavicle. Pt complains of pain to this area and toward shoulder. Full ROM.

## 2023-03-19 DIAGNOSIS — I10 Essential (primary) hypertension: Secondary | ICD-10-CM | POA: Diagnosis not present

## 2023-03-19 DIAGNOSIS — M47816 Spondylosis without myelopathy or radiculopathy, lumbar region: Secondary | ICD-10-CM | POA: Diagnosis not present

## 2023-03-19 DIAGNOSIS — M898X1 Other specified disorders of bone, shoulder: Secondary | ICD-10-CM | POA: Diagnosis not present

## 2023-03-19 DIAGNOSIS — G8929 Other chronic pain: Secondary | ICD-10-CM | POA: Diagnosis not present

## 2023-03-19 DIAGNOSIS — M5442 Lumbago with sciatica, left side: Secondary | ICD-10-CM | POA: Diagnosis not present

## 2023-11-18 DIAGNOSIS — K567 Ileus, unspecified: Secondary | ICD-10-CM | POA: Diagnosis not present

## 2023-11-18 DIAGNOSIS — R748 Abnormal levels of other serum enzymes: Secondary | ICD-10-CM | POA: Diagnosis not present

## 2023-11-18 DIAGNOSIS — R1030 Lower abdominal pain, unspecified: Secondary | ICD-10-CM | POA: Diagnosis not present

## 2023-11-18 DIAGNOSIS — K5730 Diverticulosis of large intestine without perforation or abscess without bleeding: Secondary | ICD-10-CM | POA: Diagnosis not present

## 2023-11-18 DIAGNOSIS — K760 Fatty (change of) liver, not elsewhere classified: Secondary | ICD-10-CM | POA: Diagnosis not present

## 2023-11-18 DIAGNOSIS — F17210 Nicotine dependence, cigarettes, uncomplicated: Secondary | ICD-10-CM | POA: Diagnosis not present

## 2023-11-18 DIAGNOSIS — K529 Noninfective gastroenteritis and colitis, unspecified: Secondary | ICD-10-CM | POA: Diagnosis not present

## 2023-11-18 DIAGNOSIS — N400 Enlarged prostate without lower urinary tract symptoms: Secondary | ICD-10-CM | POA: Diagnosis not present

## 2023-11-18 DIAGNOSIS — I10 Essential (primary) hypertension: Secondary | ICD-10-CM | POA: Diagnosis not present
